# Patient Record
Sex: Male | Born: 1975
Health system: Southern US, Community
[De-identification: ages and names within clinical notes are randomized; demographics above are authoritative.]

## PROBLEM LIST (undated history)

## (undated) DIAGNOSIS — L723 Sebaceous cyst: Secondary | ICD-10-CM

## (undated) DIAGNOSIS — R300 Dysuria: Secondary | ICD-10-CM

## (undated) HISTORY — DX: Dysuria: R30.0

## (undated) HISTORY — PX: HYDROCELE EXCISION / REPAIR: SUR1145

## (undated) HISTORY — PX: TONSILLECTOMY: SUR1361

## (undated) HISTORY — DX: Sebaceous cyst: L72.3

---

## 2010-01-11 ENCOUNTER — Encounter: Admission: RE | Admit: 2010-01-11 | Discharge: 2010-01-11 | Payer: Self-pay | Admitting: General Surgery

## 2010-05-01 IMAGING — CT CT PELVIS W/ CM
3 series · 13 of 36 positions shown, 19 images · IV contrast (READICAT/WATER & [ID] OMNI [id number])
Comparison: None.

CLINICAL DATA: Left groin / testicular pain.  Question hernia.
History of hydrocele.

CT PELVIS WITH CONTRAST
TECHNIQUE: Multidetector CT imaging of the pelvis was performed
using the standard protocol following the bolus administration of
intravenous contrast.
Contrast:  100 ml Pmnipaque-YSS intravenously.

[Series 3: routine pelvis · axial · 0.79mm/px · z∈[-294,-79]mm · 7 of 59 slices shown, 12 images]
[im 8/59  soft-tissue]
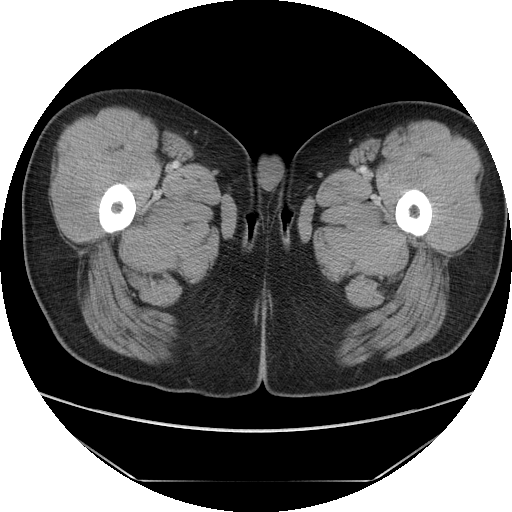
[im 8/59  bone]
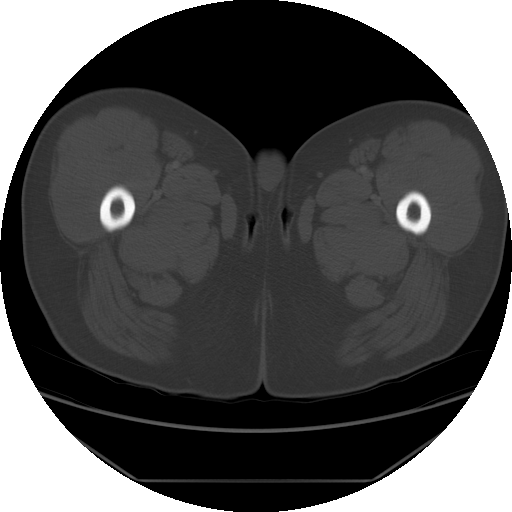
[im 15/59  soft-tissue]
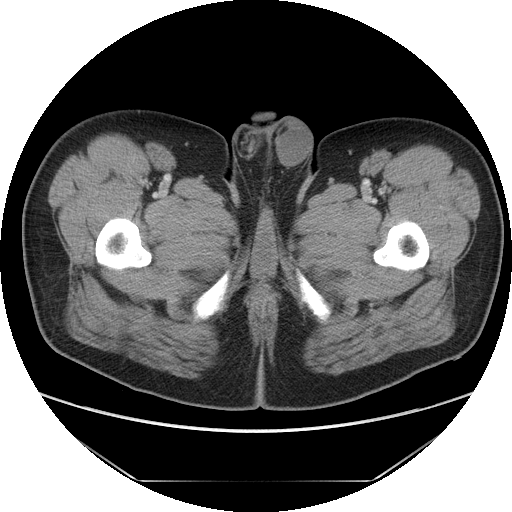
[im 22/59  soft-tissue]
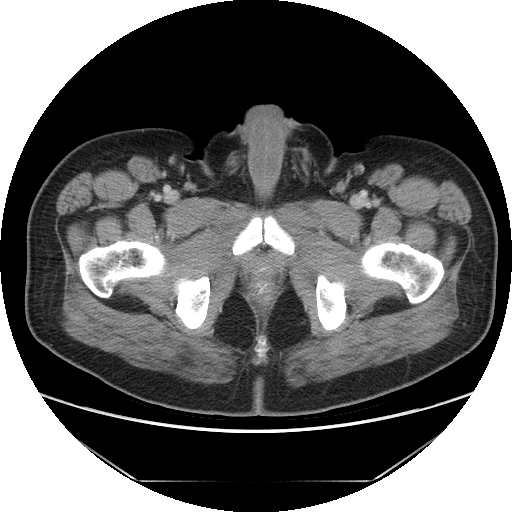
[im 30/59  soft-tissue]
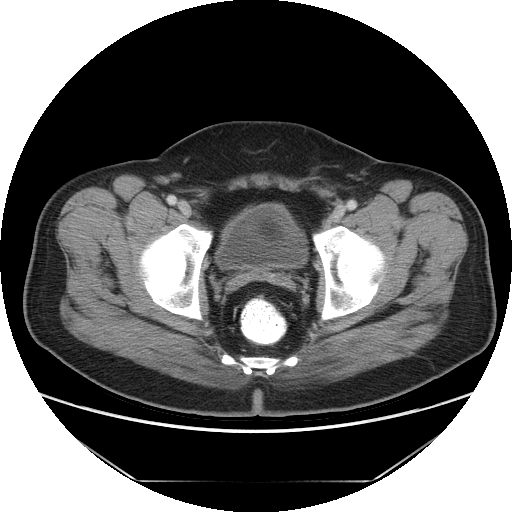
[im 30/59  lung]
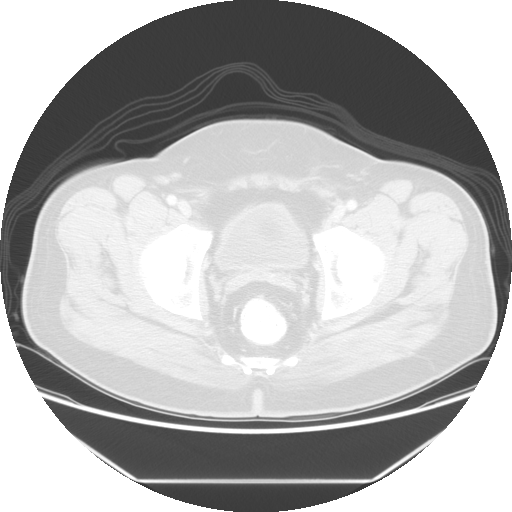
[im 37/59  soft-tissue]
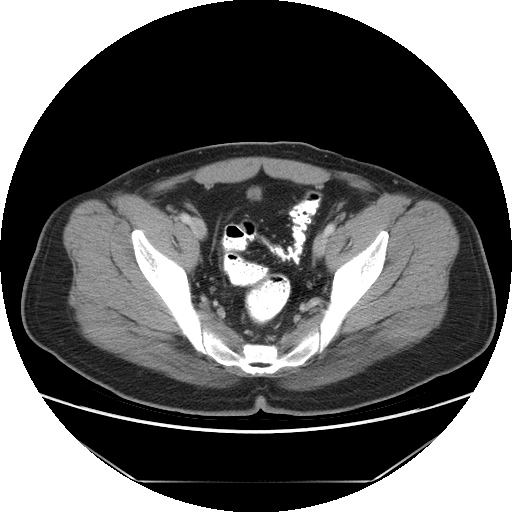
[im 37/59  lung]
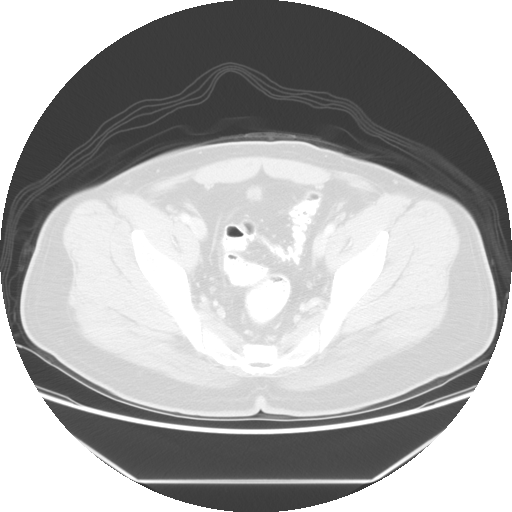
[im 44/59  soft-tissue]
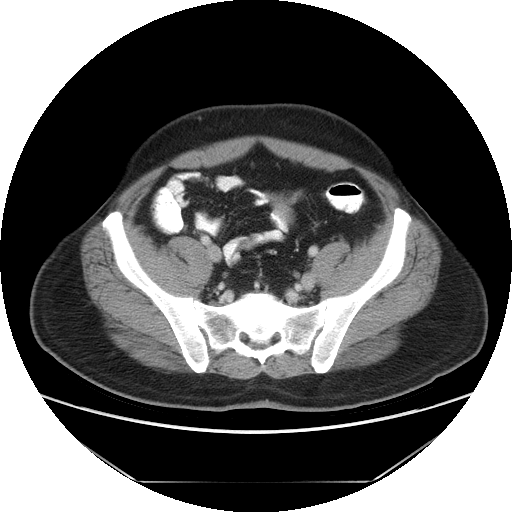
[im 44/59  lung]
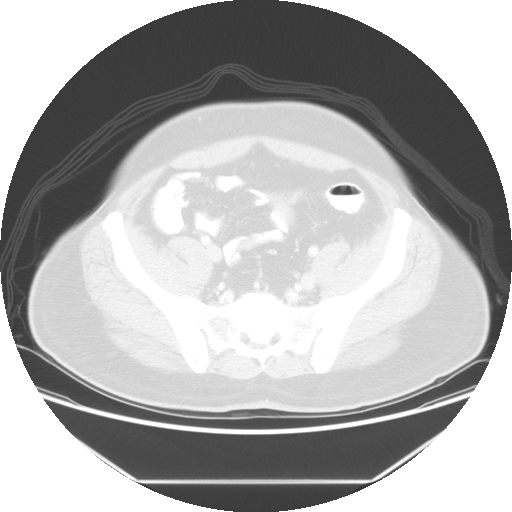
[im 51/59  soft-tissue]
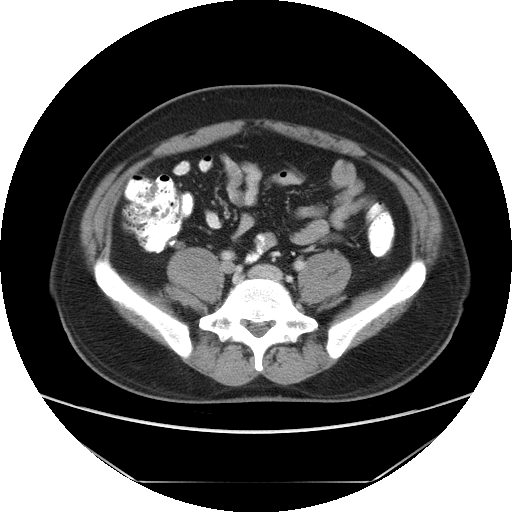
[im 51/59  lung]
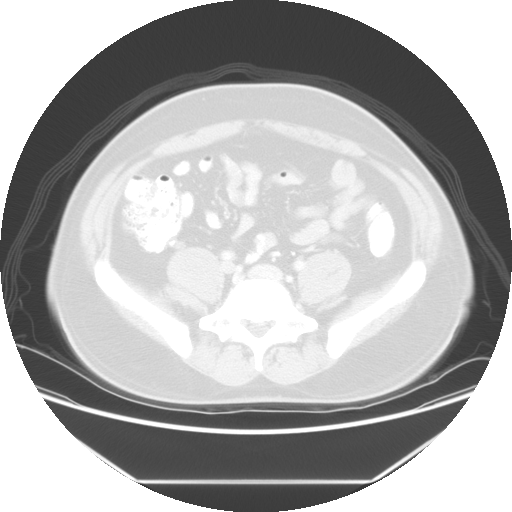

[Series 601: coronal body · coronal · 0.79mm/px · 1 of 114 slices shown, 2 images]
[im 38/114  soft-tissue]
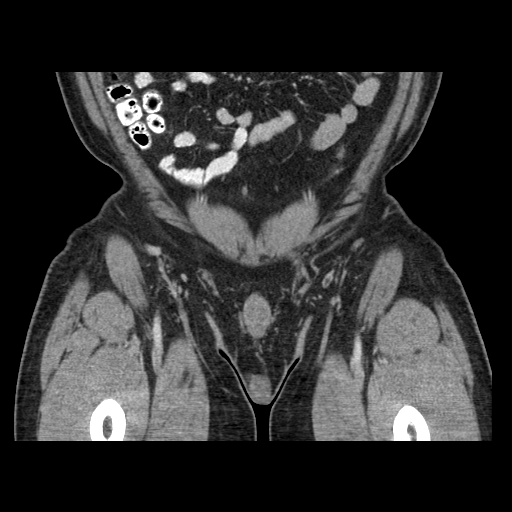
[im 38/114  bone]
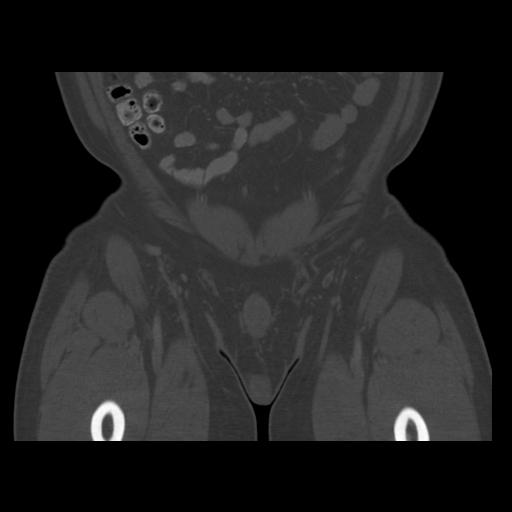

[Series 602: sagittal body · sagittal · 0.79mm/px · 5 of 156 slices shown]
[im 15/156  soft-tissue]
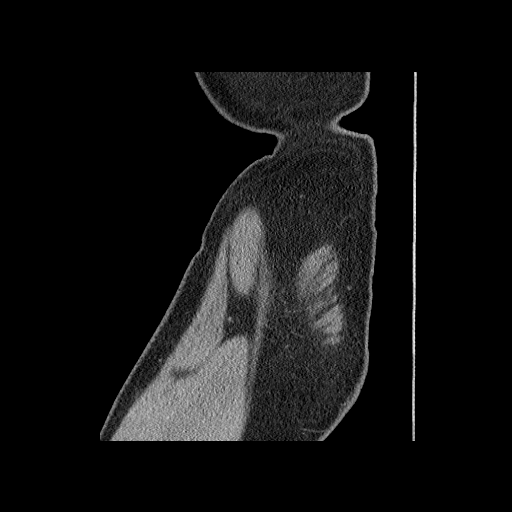
[im 29/156  soft-tissue]
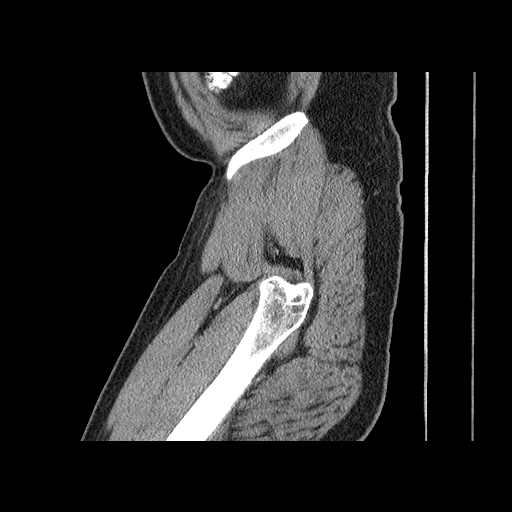
[im 50/156  soft-tissue]
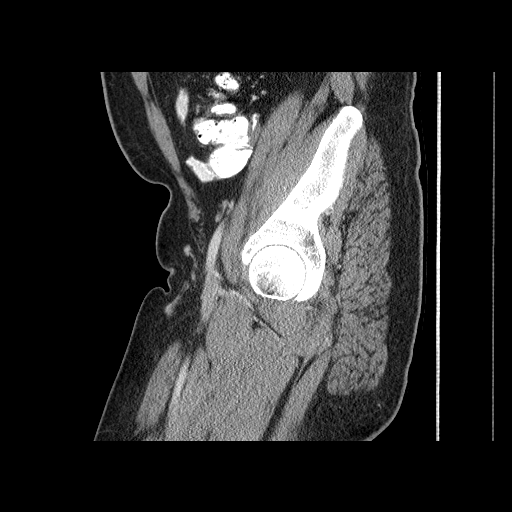
[im 71/156  soft-tissue]
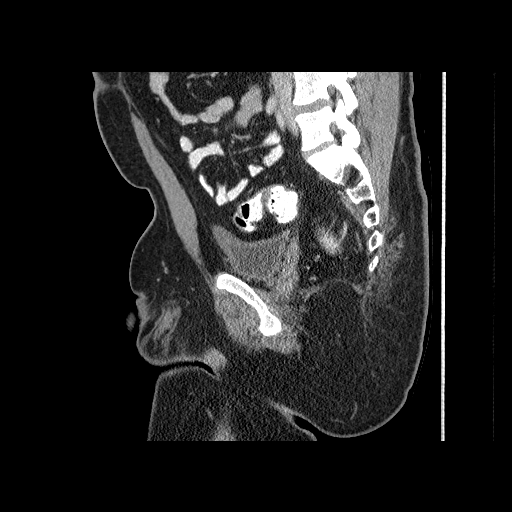
[im 85/156  soft-tissue]
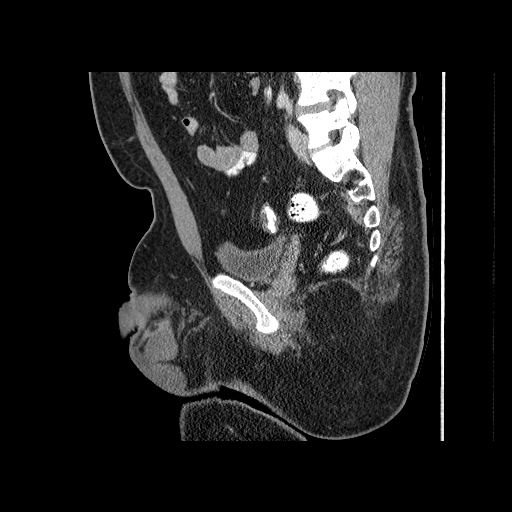

[13 of 36 positions shown; findings below may reference images not displayed]

FINDINGS: There is no evidence of inguinal or femoral hernia.  The
left testis is positioned slightly more superiorly within the
scrotum, but appears normally descended.  The testes are similar in
size.  No significant hydrocele is apparent.  There is no inguinal
adenopathy or inflammatory process.

The urinary bladder is nearly empty.  A small urachal remnant is
noted.  The prostate gland and seminal vesicles appear normal.  The
small and large bowel in the pelvis appear normal.  The appendix
appears normal.  There is no pelvic adenopathy.  No osseous
abnormalities are identified.
IMPRESSION: 1.  No evidence of inguinal hernia.
2.  Slight asymmetry in position of the testes, within normal
limits.  No apparent hydrocele.  Correlate clinically.
3.  No demonstrated acute pelvic findings.

## 2017-03-26 DIAGNOSIS — R300 Dysuria: Secondary | ICD-10-CM | POA: Diagnosis not present

## 2017-03-26 DIAGNOSIS — R109 Unspecified abdominal pain: Secondary | ICD-10-CM | POA: Diagnosis not present

## 2017-03-27 ENCOUNTER — Other Ambulatory Visit: Payer: Self-pay | Admitting: Internal Medicine

## 2017-03-27 DIAGNOSIS — R300 Dysuria: Secondary | ICD-10-CM

## 2017-03-27 DIAGNOSIS — R109 Unspecified abdominal pain: Secondary | ICD-10-CM

## 2017-04-01 ENCOUNTER — Ambulatory Visit
Admission: RE | Admit: 2017-04-01 | Discharge: 2017-04-01 | Disposition: A | Discharge status: Home / Self Care | Payer: 59 | Source: Ambulatory Visit | Attending: Internal Medicine | Admitting: Internal Medicine

## 2017-04-01 DIAGNOSIS — R300 Dysuria: Secondary | ICD-10-CM | POA: Diagnosis not present

## 2017-04-01 DIAGNOSIS — R109 Unspecified abdominal pain: Secondary | ICD-10-CM

## 2017-09-25 DIAGNOSIS — Z131 Encounter for screening for diabetes mellitus: Secondary | ICD-10-CM | POA: Diagnosis not present

## 2017-09-25 DIAGNOSIS — Z0000 Encounter for general adult medical examination without abnormal findings: Secondary | ICD-10-CM | POA: Diagnosis not present

## 2017-09-25 DIAGNOSIS — Z125 Encounter for screening for malignant neoplasm of prostate: Secondary | ICD-10-CM | POA: Diagnosis not present

## 2017-10-07 DIAGNOSIS — R631 Polydipsia: Secondary | ICD-10-CM | POA: Diagnosis not present

## 2017-10-28 DIAGNOSIS — Z0000 Encounter for general adult medical examination without abnormal findings: Secondary | ICD-10-CM | POA: Diagnosis not present

## 2017-10-28 DIAGNOSIS — G4733 Obstructive sleep apnea (adult) (pediatric): Secondary | ICD-10-CM | POA: Diagnosis not present

## 2017-10-28 DIAGNOSIS — R300 Dysuria: Secondary | ICD-10-CM | POA: Diagnosis not present

## 2017-11-02 DIAGNOSIS — L720 Epidermal cyst: Secondary | ICD-10-CM | POA: Diagnosis not present

## 2017-11-16 DIAGNOSIS — R300 Dysuria: Secondary | ICD-10-CM | POA: Diagnosis not present

## 2017-11-16 DIAGNOSIS — R3911 Hesitancy of micturition: Secondary | ICD-10-CM | POA: Diagnosis not present

## 2017-11-16 DIAGNOSIS — R39198 Other difficulties with micturition: Secondary | ICD-10-CM | POA: Diagnosis not present

## 2017-12-08 ENCOUNTER — Ambulatory Visit: Payer: 59 | Admitting: Neurology

## 2017-12-08 ENCOUNTER — Encounter: Payer: Self-pay | Admitting: Neurology

## 2017-12-08 VITALS — BP 146/91 | HR 108 | Ht 70.0 in | Wt 176.0 lb

## 2017-12-08 DIAGNOSIS — G4719 Other hypersomnia: Secondary | ICD-10-CM | POA: Diagnosis not present

## 2017-12-08 DIAGNOSIS — R0683 Snoring: Secondary | ICD-10-CM | POA: Diagnosis not present

## 2017-12-08 DIAGNOSIS — G479 Sleep disorder, unspecified: Secondary | ICD-10-CM

## 2017-12-08 DIAGNOSIS — G478 Other sleep disorders: Secondary | ICD-10-CM

## 2017-12-08 NOTE — Patient Instructions (Addendum)
Thank you for choosing [facility] for your sleep related care! It was nice to meet you today! I appreciate that you entrust me with your sleep related healthcare concerns. I hope, I was able to address at least some of your concerns today, and that I can help you feel reassured and also get better.    Here is what we discussed today and what we came up with as our plan for you:    Based on your symptoms and your exam I believe you may be a some risk for obstructive sleep apnea or OSA, and I think we should proceed with a sleep study to determine whether you do or do not have OSA and how severe it is. If you have more than mild OSA, I want you to consider treatment with CPAP. Please remember, the risks and ramifications of moderate to severe obstructive sleep apnea or OSA are: Cardiovascular disease, including congestive heart failure, stroke, difficult to control hypertension, arrhythmias, and even type 2 diabetes has been linked to untreated OSA. Sleep apnea causes disruption of sleep and sleep deprivation in most cases, which, in turn, can cause recurrent headaches, problems with memory, mood, concentration, focus, and vigilance. Most people with untreated sleep apnea report excessive daytime sleepiness, which can affect their ability to drive. Please do not drive if you feel sleepy.   I will likely see you back after your sleep study to go over the test results and where to go from there. We will call you after your sleep study to advise about the results (most likely, you will hear from AllenKristen, my nurse) and to set up an appointment at the time, as necessary.    Our sleep lab administrative assistant will call you to schedule your sleep study. If you don't hear back from her by about 2 weeks from now, please feel free to call her at [phone]. You can leave a message with your phone number and concerns, if you get the voicemail box. She will call back as soon as possible.

## 2017-12-08 NOTE — Progress Notes (Signed)
Subjective:    Patient ID: Jared Larson is a 42 y.o. male.  HPI     Jared FoleySaima Christen Bedoya, MD, PhD [facility] [address], [address] [address] [place], [place] [address]  Dear Dr. Nicholos Larson,   I saw your patient, Jared Larson, upon your kind request, in my neurologic clinic today for initial consultation of his sleep disorder, in particular, concern for underlying obstructive sleep apnea. The patient is unaccompanied today. As you know, Jared Larson is a 42 year old right-handed gentleman with an underlying medical history of back pain, dysuria, and borderline overweight state, who reports snoring and sleep disruption, difficulty maintaining sleep. I reviewed your office note from 10/28/2017, which you kindly included. His Epworth sleepiness score is 2 out of 24 today, fatigue score is 21 out of 63. He had a tonsillectomy as a child. He is married and lives with his wife and 2 children. He works for on [employer]. He drinks caffeine in the form of coffee, usually 2 cups per day, drinks alcohol occasionally a couple of times per month and does not smoke. He reports that his sleep difficulties started in October. He felt that he would wake up soon after he fell asleep. He has also had some issues with bad taste in his mouth and halitosis. He saw his dentist for this and had a deep cleaning done which helped. In addition, sometime in November he started using 2 pillows to sleep on and felt that his snoring and his sleep disruption improved. He denies any telltale symptoms of regurgitation or acid reflux. He denies restless leg symptoms or parasomnias. He is not aware of any apneic breathing pauses. He has an 41-year-old son and a 356-year-old daughter. His children sleep in their bedroom. He does have a history of deviated septum and also injury to the nasal bridge when he was around 42 years old. He has intermittent difficulty breathing through his nose. He had recent  blood work in your office in November 2018 which was reportedly benign per patient.   His Past Medical History Is Significant For: Past Medical History:  Diagnosis Date  . Dysuria   . Sebaceous cyst     His Past Surgical History Is Significant For: TE as a child.   His Family History Is Significant For: Family History  Problem Relation Age of Onset  . Diabetes Father     His Social History Is Significant For: Social History   Socioeconomic History  . Marital status: Unknown    Spouse name: None  . Number of children: None  . Years of education: None  . Highest education level: None  Social Needs  . Financial resource strain: None  . Food insecurity - worry: None  . Food insecurity - inability: None  . Transportation needs - medical: None  . Transportation needs - non-medical: None  Occupational History  . None  Tobacco Use  . Smoking status: Never Smoker  . Smokeless tobacco: Never Used  Substance and Sexual Activity  . Alcohol use: Yes  . Drug use: No  . Sexual activity: None  Other Topics Concern  . None  Social History Narrative  . None    His Allergies Are:  No Known Allergies:   His Current Medications Are:  No outpatient encounter medications on file as of 12/08/2017.   No facility-administered encounter medications on file as of 12/08/2017.   :  Review of Systems:  Out of a complete 14 point review of systems, all are  reviewed and negative with the exception of these symptoms as listed below: Review of Systems  Neurological:       Pt presents today to discuss his sleep. Pt has never had a sleep study but does endorse snoring.  Epworth Sleepiness Scale 0= would never doze 1= slight chance of dozing 2= moderate chance of dozing 3= high chance of dozing  Sitting and reading: 0 Watching TV: 0 Sitting inactive in a public place (ex. Theater or meeting): 0 As a passenger in a car for an hour without a break: 0 Lying down to rest in the afternoon:  1 Sitting and talking to someone: 0 Sitting quietly after lunch (no alcohol): 1 In a car, while stopped in traffic: 0 Total: 2     Objective:  Neurological Exam  Physical Exam Physical Examination:   Vitals:   12/08/17 1331  BP: (!) 146/91  Pulse: (!) 108   General Examination: The patient is a very pleasant 42 y.o. male in no acute distress. He appears well-developed and well-nourished and well groomed.    HEENT: Normocephalic, atraumatic, pupils are equal, round and reactive to light and accommodation. Extraocular tracking is good without limitation to gaze excursion or nystagmus noted. Normal smooth pursuit is noted. Hearing is grossly intact. Face is symmetric with normal facial animation and normal facial sensation. Speech is clear with no dysarthria noted. There is no hypophonia. There is no lip, neck/head, jaw or voice tremor. Neck is supple with full range of passive and active motion. There are no carotid bruits on auscultation. Oropharynx exam reveals: mild mouth dryness, good dental hygiene and mild airway crowding, due to smaller airway entry and redundant soft palate. Mallampati is class II. Tongue protrudes centrally and palate elevates symmetrically. Tonsils are absent. Neck size is 16.5 inches. He has a mild overbite. Nasal inspection reveals no significant nasal mucosal bogginess or redness, but he has septal deviation to the R.   Chest: Clear to auscultation without wheezing, rhonchi or crackles noted.  Heart: S1+S2+0, regular and normal without murmurs, rubs or gallops noted.   Abdomen: Soft, non-tender and non-distended with normal bowel sounds appreciated on auscultation.  Extremities: There is no pitting edema in the distal lower extremities bilaterally. Pedal pulses are intact.  Skin: Warm and dry without trophic changes noted.  Musculoskeletal: exam reveals no obvious joint deformities, tenderness or joint swelling or erythema.   Neurologically:  Mental  status: The patient is awake, alert and oriented in all 4 spheres. His immediate and remote memory, attention, language skills and fund of knowledge are appropriate. There is no evidence of aphasia, agnosia, apraxia or anomia. Speech is clear with normal prosody and enunciation. Thought process is linear. Mood is normal and affect is normal.  Cranial nerves II - XII are as described above under HEENT exam. In addition: shoulder shrug is normal with equal shoulder height noted. Motor exam: Normal bulk, strength and tone is noted. There is no drift, tremor or rebound. Romberg is negative. Reflexes are 1+ throughout. Fine motor skills and coordination: intact with normal finger taps, normal hand movements, normal rapid alternating patting, normal foot taps and normal foot agility.  Cerebellar testing: No dysmetria or intention tremor on finger to nose testing. Heel to shin is unremarkable bilaterally. There is no truncal or gait ataxia.  Sensory exam: intact to light touch in the upper and lower extremities.  Gait, station and balance: He stands easily. No veering to one side is noted. No leaning to one side  is noted. Posture is age-appropriate and stance is narrow based. Gait shows normal stride length and normal pace. No problems turning are noted. Tandem walk is unremarkable.      Assessment and Plan:  In summary, Jared Larson is a very pleasant 42 y.o.-year old male with an underlying medical history of back pain, dysuria, and borderline overweight state, who presents for sleep consultation for his sleep disruption, nonrestorative sleep, daytime tiredness and snoring. Symptoms improved when he started sleeping a little bit more upright. Nevertheless, underlying obstructive sleep apnea cannot be excluded. As you know, in patient's from [place] descent obstructive sleep apnea is not well correlated with being obese. I had a long chat with the patient about my findings and the diagnosis of OSA, its  prognosis and treatment options. We talked about medical treatments, surgical interventions and non-pharmacological approaches. I explained in particular the risks and ramifications of untreated moderate to severe OSA, especially with respect to developing cardiovascular disease down the Road, including congestive heart failure, difficult to treat hypertension, cardiac arrhythmias, or stroke. Even type 2 diabetes has, in part, been linked to untreated OSA. Symptoms of untreated OSA include daytime sleepiness, memory problems, mood irritability and mood disorder such as depression and anxiety, lack of energy, as well as recurrent headaches, especially morning headaches. We talked about trying to maintain a healthy lifestyle in general, as well as the importance of weight control. I encouraged the patient to eat healthy, exercise daily and keep well hydrated, to keep a scheduled bedtime and wake time routine, to not skip any meals and eat healthy snacks in between meals. I advised the patient not to drive when feeling sleepy. I recommended the following at this time: sleep study with potential positive airway pressure titration. (We will score hypopneas at 4%). Is willing to get tested. I suggested follow-up after testing if needed. I answered all his questions today and he was in agreement.   Thank you very much for allowing me to participate in the care of this nice patient. If I can be of any further assistance to you please do not hesitate to call me at [phone].  Sincerely,   Jared Foley, MD, PhD

## 2017-12-14 ENCOUNTER — Telehealth: Payer: Self-pay

## 2017-12-14 DIAGNOSIS — G479 Sleep disorder, unspecified: Secondary | ICD-10-CM

## 2017-12-14 NOTE — Telephone Encounter (Signed)
HST ordered.

## 2017-12-14 NOTE — Telephone Encounter (Signed)
[employer] will deny the in-lab study, does not meet requirements. Can you order a HST?

## 2017-12-16 ENCOUNTER — Telehealth: Payer: Self-pay | Admitting: Neurology

## 2017-12-16 NOTE — Telephone Encounter (Signed)
Pt stated he wants to wait on doing sleep study due to financial cost and he is feeling better. I informed pt that if he decides to have sleep study done just give me a call back and I will gladly get him scheduled. Pt verbalized understanding this.

## 2017-12-16 NOTE — Telephone Encounter (Signed)
Noted, thanks!

## 2018-01-25 DIAGNOSIS — R300 Dysuria: Secondary | ICD-10-CM | POA: Diagnosis not present

## 2018-01-25 DIAGNOSIS — R39198 Other difficulties with micturition: Secondary | ICD-10-CM | POA: Diagnosis not present

## 2018-01-25 DIAGNOSIS — G8929 Other chronic pain: Secondary | ICD-10-CM | POA: Diagnosis not present

## 2018-01-25 DIAGNOSIS — R102 Pelvic and perineal pain: Secondary | ICD-10-CM | POA: Diagnosis not present

## 2018-02-15 DIAGNOSIS — R102 Pelvic and perineal pain: Secondary | ICD-10-CM | POA: Diagnosis not present

## 2018-02-15 DIAGNOSIS — R3911 Hesitancy of micturition: Secondary | ICD-10-CM | POA: Diagnosis not present

## 2018-02-15 DIAGNOSIS — R300 Dysuria: Secondary | ICD-10-CM | POA: Diagnosis not present

## 2018-06-01 IMAGING — US US RENAL
1 series · 14 of 25 positions shown · non-contrast
Comparison: CT 01/11/2010

CLINICAL DATA: Dysuria bilateral flank pain

EXAM:
RENAL / URINARY TRACT ULTRASOUND COMPLETE

[Series 1: us renal · 0.25mm/px · 14 of 41 slices shown]
[im 1/41]
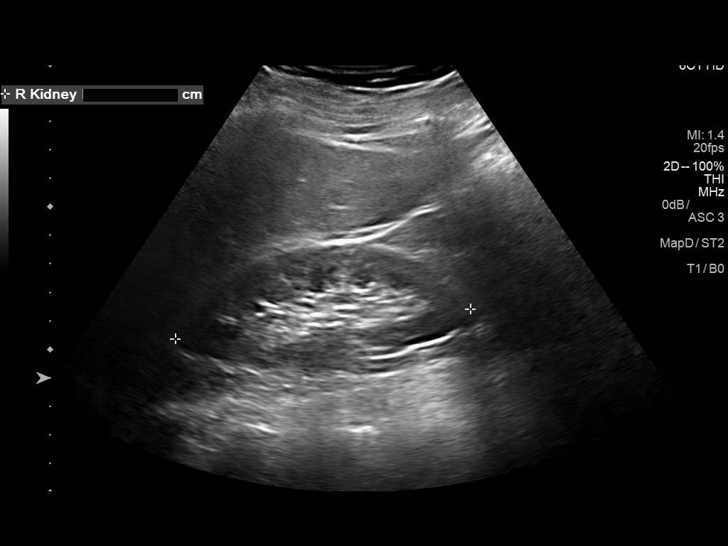
[im 4/41]
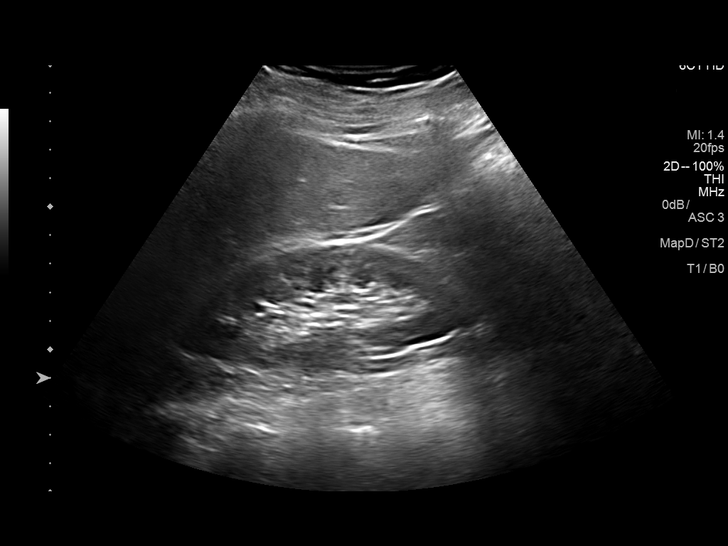
[im 7/41]
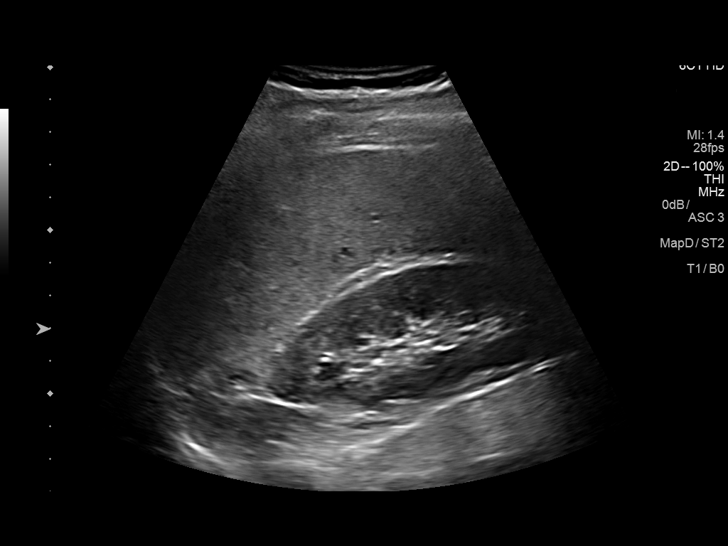
[im 11/41]
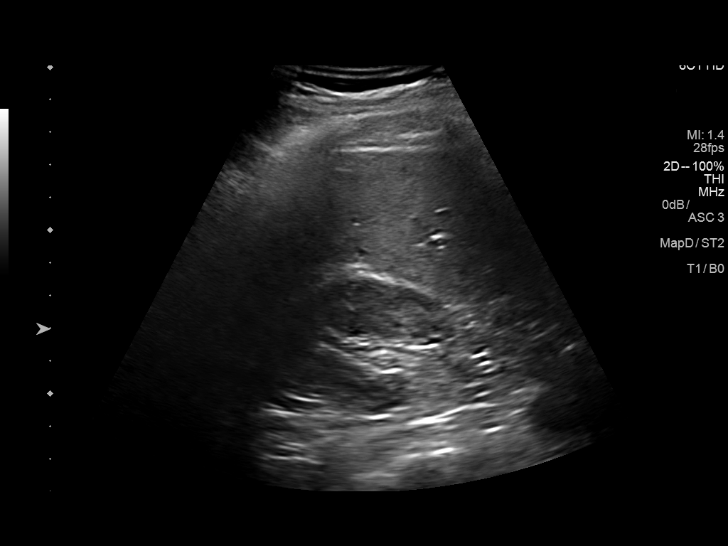
[im 14/41]
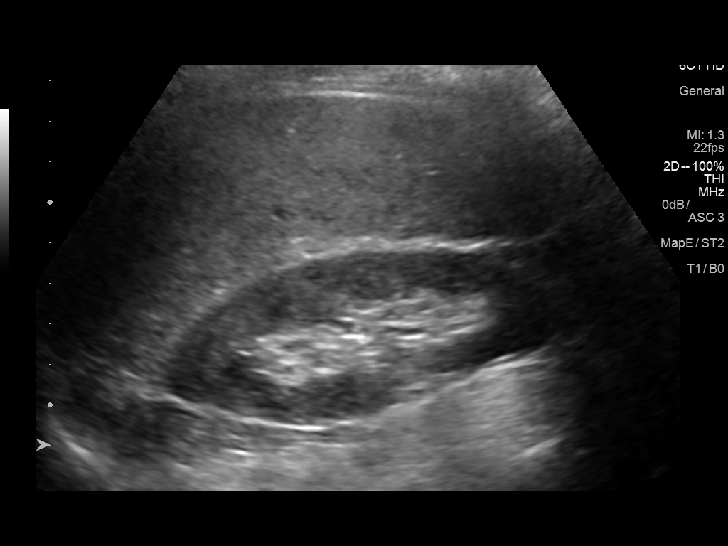
[im 16/41]
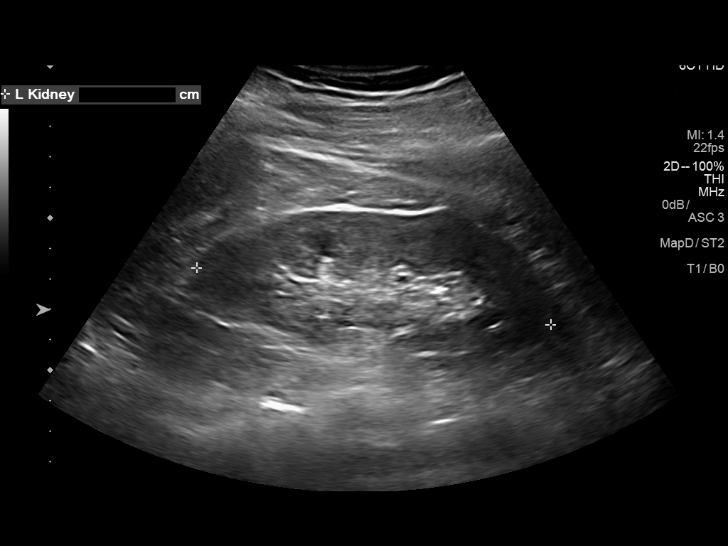
[im 19/41]
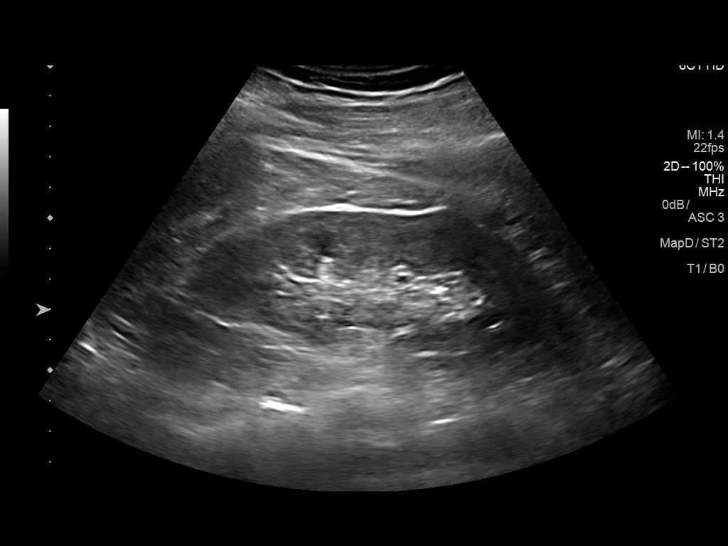
[im 22/41]
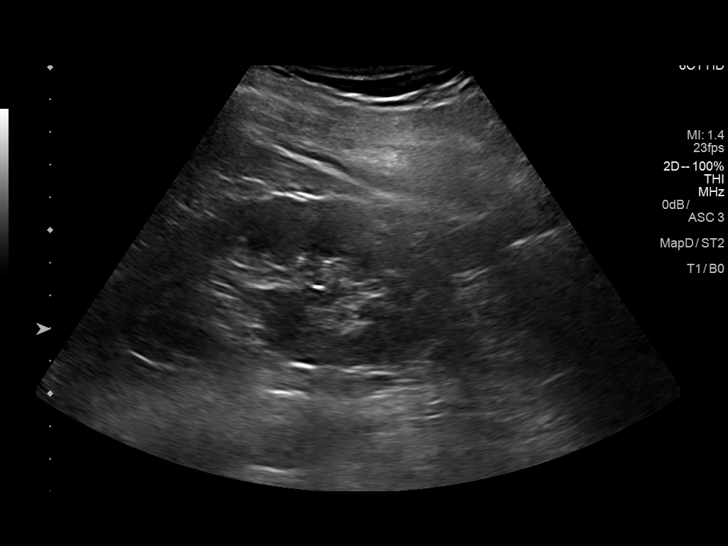
[im 26/41]
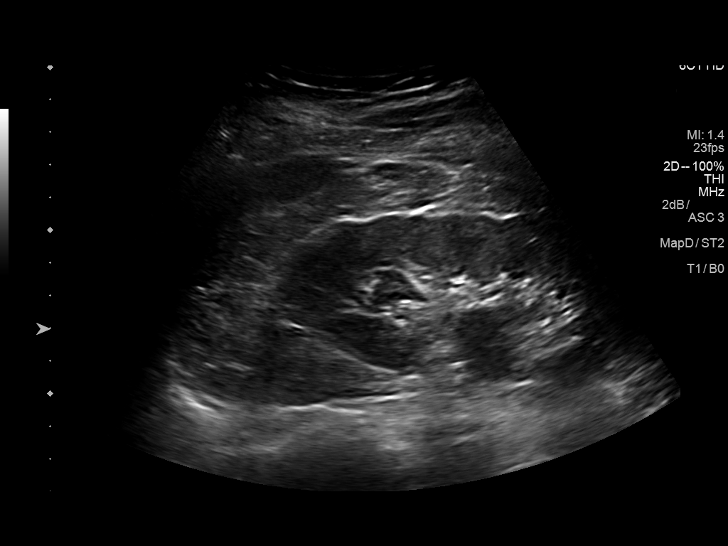
[im 27/41]
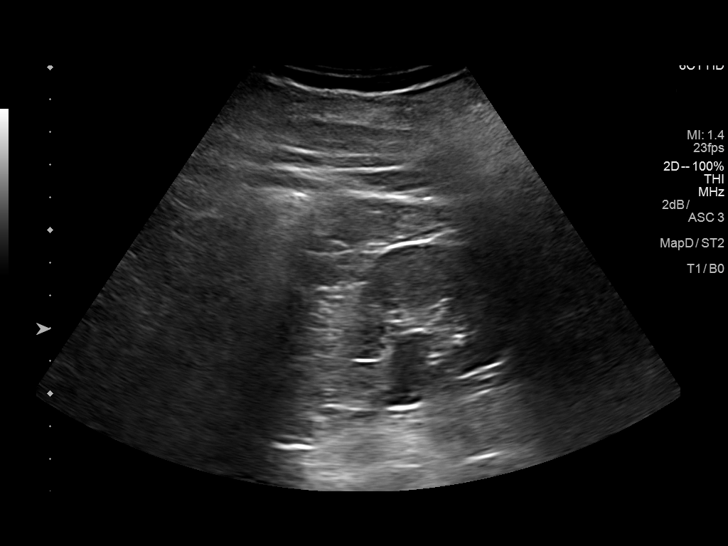
[im 31/41]
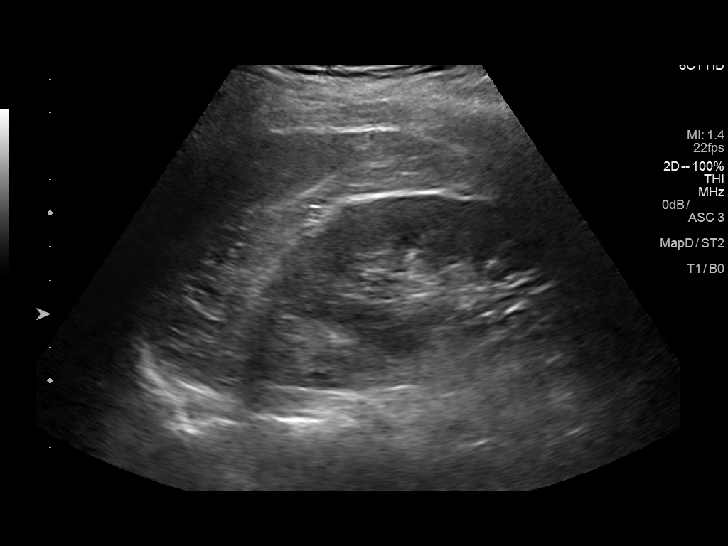
[im 34/41]
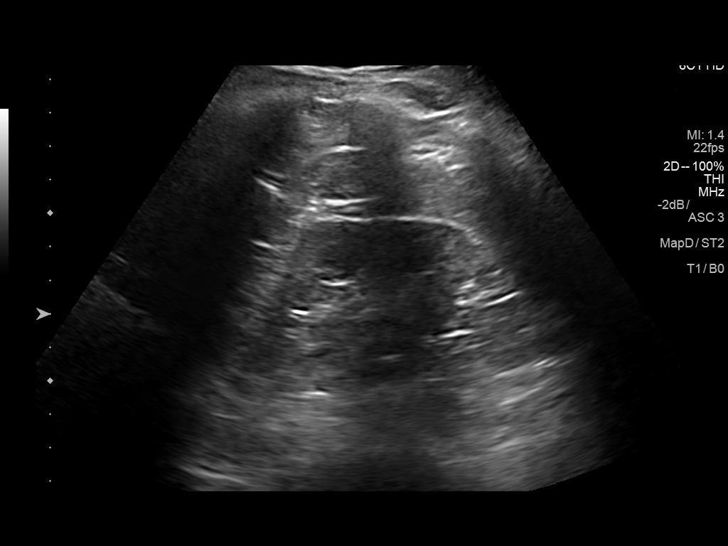
[im 37/41]
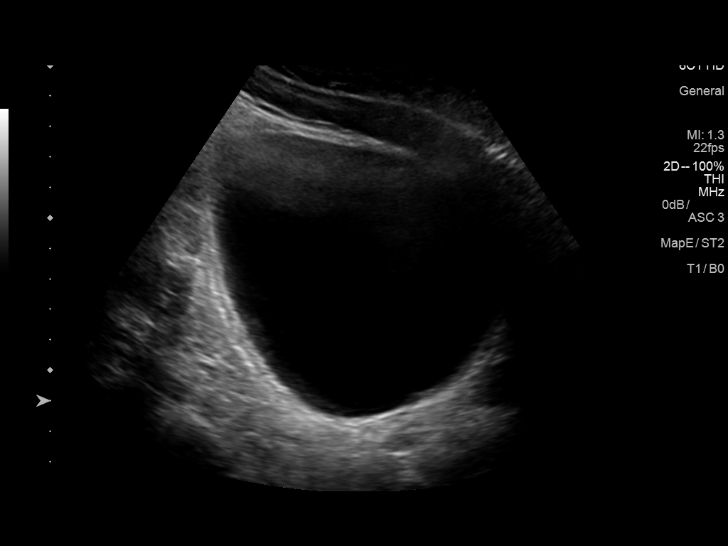
[im 41/41]
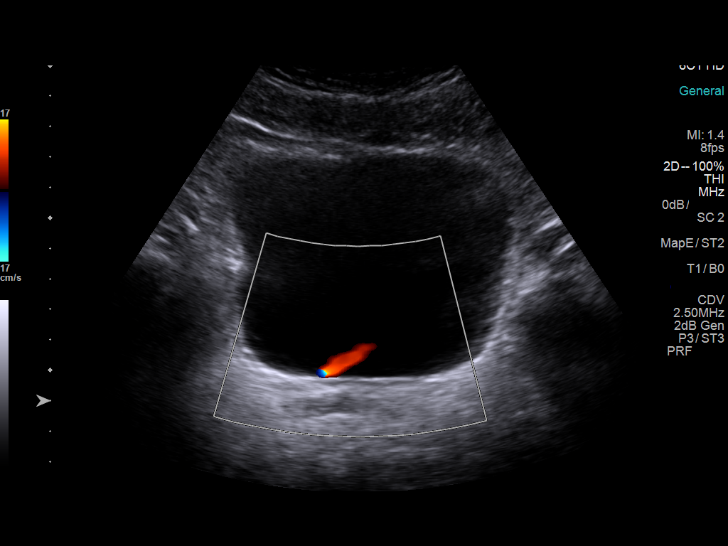

[14 of 25 positions shown; findings below may reference images not displayed]

FINDINGS: Right Kidney:

Length: 10.3 cm. Echogenicity within normal limits. No mass or
hydronephrosis visualized.

Left Kidney:

Length: Minimal hydronephrosis left kidney.. Echogenicity within
normal limits. No mass or hydronephrosis visualized.

Bladder:

Appears normal for degree of bladder distention.
IMPRESSION: Findings suggestive of minimal left hydronephrosis. If ureteral
stone is suspected, CT KUB could be obtained for further evaluation.

## 2018-06-12 DIAGNOSIS — L237 Allergic contact dermatitis due to plants, except food: Secondary | ICD-10-CM | POA: Diagnosis not present

## 2018-07-09 DIAGNOSIS — R399 Unspecified symptoms and signs involving the genitourinary system: Secondary | ICD-10-CM | POA: Diagnosis not present

## 2018-07-09 DIAGNOSIS — N200 Calculus of kidney: Secondary | ICD-10-CM | POA: Diagnosis not present

## 2018-07-09 DIAGNOSIS — R109 Unspecified abdominal pain: Secondary | ICD-10-CM | POA: Diagnosis not present

## 2018-10-27 DIAGNOSIS — Z131 Encounter for screening for diabetes mellitus: Secondary | ICD-10-CM | POA: Diagnosis not present

## 2018-10-27 DIAGNOSIS — Z0000 Encounter for general adult medical examination without abnormal findings: Secondary | ICD-10-CM | POA: Diagnosis not present

## 2018-10-27 DIAGNOSIS — Z125 Encounter for screening for malignant neoplasm of prostate: Secondary | ICD-10-CM | POA: Diagnosis not present

## 2018-11-03 DIAGNOSIS — N200 Calculus of kidney: Secondary | ICD-10-CM | POA: Diagnosis not present

## 2018-11-03 DIAGNOSIS — Z0000 Encounter for general adult medical examination without abnormal findings: Secondary | ICD-10-CM | POA: Diagnosis not present

## 2020-07-25 ENCOUNTER — Other Ambulatory Visit: Payer: Self-pay

## 2020-07-25 ENCOUNTER — Other Ambulatory Visit: Payer: 59

## 2020-07-25 DIAGNOSIS — Z20822 Contact with and (suspected) exposure to covid-19: Secondary | ICD-10-CM

## 2020-07-27 LAB — SARS-COV-2, NAA 2 DAY TAT

## 2020-07-27 LAB — NOVEL CORONAVIRUS, NAA: SARS-CoV-2, NAA: NOT DETECTED

## 2022-09-10 DIAGNOSIS — Z1212 Encounter for screening for malignant neoplasm of rectum: Secondary | ICD-10-CM | POA: Diagnosis not present

## 2022-09-10 DIAGNOSIS — Z1211 Encounter for screening for malignant neoplasm of colon: Secondary | ICD-10-CM | POA: Diagnosis not present

## 2022-12-23 DIAGNOSIS — Z0000 Encounter for general adult medical examination without abnormal findings: Secondary | ICD-10-CM | POA: Diagnosis not present

## 2022-12-23 DIAGNOSIS — Z125 Encounter for screening for malignant neoplasm of prostate: Secondary | ICD-10-CM | POA: Diagnosis not present

## 2022-12-30 DIAGNOSIS — Z125 Encounter for screening for malignant neoplasm of prostate: Secondary | ICD-10-CM | POA: Diagnosis not present

## 2022-12-30 DIAGNOSIS — N200 Calculus of kidney: Secondary | ICD-10-CM | POA: Diagnosis not present

## 2022-12-30 DIAGNOSIS — Z23 Encounter for immunization: Secondary | ICD-10-CM | POA: Diagnosis not present

## 2022-12-30 DIAGNOSIS — Z0000 Encounter for general adult medical examination without abnormal findings: Secondary | ICD-10-CM | POA: Diagnosis not present

## 2023-03-05 DIAGNOSIS — R52 Pain, unspecified: Secondary | ICD-10-CM | POA: Diagnosis not present

## 2023-03-05 DIAGNOSIS — R509 Fever, unspecified: Secondary | ICD-10-CM | POA: Diagnosis not present
